# Patient Record
Sex: Female | Born: 1990 | Race: Black or African American | Hispanic: No | Marital: Single | Smoking: Former smoker
Health system: Southern US, Community
[De-identification: ages and names within clinical notes are randomized; demographics above are authoritative.]

---

## 2015-11-28 ENCOUNTER — Emergency Department (HOSPITAL_COMMUNITY): Payer: Medicaid Other

## 2015-11-28 ENCOUNTER — Encounter (HOSPITAL_COMMUNITY): Payer: Self-pay | Admitting: *Deleted

## 2015-11-28 ENCOUNTER — Emergency Department (HOSPITAL_COMMUNITY)
Admission: EM | Admit: 2015-11-28 | Discharge: 2015-11-28 | Disposition: A | Discharge status: Home / Self Care | Payer: Medicaid Other | Attending: Emergency Medicine | Admitting: Emergency Medicine

## 2015-11-28 DIAGNOSIS — S9031XA Contusion of right foot, initial encounter: Secondary | ICD-10-CM

## 2015-11-28 DIAGNOSIS — Y939 Activity, unspecified: Secondary | ICD-10-CM | POA: Insufficient documentation

## 2015-11-28 DIAGNOSIS — S99921A Unspecified injury of right foot, initial encounter: Secondary | ICD-10-CM | POA: Diagnosis present

## 2015-11-28 DIAGNOSIS — Y999 Unspecified external cause status: Secondary | ICD-10-CM | POA: Insufficient documentation

## 2015-11-28 DIAGNOSIS — Y92410 Unspecified street and highway as the place of occurrence of the external cause: Secondary | ICD-10-CM | POA: Diagnosis not present

## 2015-11-28 MED ORDER — IBUPROFEN 800 MG PO TABS: 800.0000 mg | ORAL_TABLET | Freq: Three times a day (TID) | ORAL | 0 refills | Status: DC

## 2015-11-28 NOTE — ED Notes (Signed)
ED Provider at bedside. 

## 2015-11-28 NOTE — ED Notes (Signed)
Pt verbalized understanding of discharge instructions and denies any further questions at this time.   
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
## 2016-07-25 ENCOUNTER — Encounter: Payer: Self-pay | Admitting: *Deleted

## 2016-07-25 ENCOUNTER — Ambulatory Visit (INDEPENDENT_AMBULATORY_CARE_PROVIDER_SITE_OTHER): Payer: Medicaid Other | Admitting: Certified Nurse Midwife

## 2016-07-25 ENCOUNTER — Encounter: Payer: Self-pay | Admitting: Certified Nurse Midwife

## 2016-07-25 ENCOUNTER — Other Ambulatory Visit (HOSPITAL_COMMUNITY)
Admission: RE | Admit: 2016-07-25 | Discharge: 2016-07-25 | Disposition: A | Discharge status: Home / Self Care | Payer: Medicaid Other | Source: Ambulatory Visit | Attending: Certified Nurse Midwife | Admitting: Certified Nurse Midwife

## 2016-07-25 VITALS — BP 116/71 | HR 98 | Ht 67.0 in | Wt 166.0 lb

## 2016-07-25 DIAGNOSIS — O0990 Supervision of high risk pregnancy, unspecified, unspecified trimester: Secondary | ICD-10-CM | POA: Insufficient documentation

## 2016-07-25 DIAGNOSIS — Z3482 Encounter for supervision of other normal pregnancy, second trimester: Secondary | ICD-10-CM

## 2016-07-25 DIAGNOSIS — Z3480 Encounter for supervision of other normal pregnancy, unspecified trimester: Secondary | ICD-10-CM

## 2016-07-25 MED ORDER — PRENATE PIXIE 10-0.6-0.4-200 MG PO CAPS: 1.0000 | ORAL_CAPSULE | Freq: Every day | ORAL | 12 refills | Status: AC

## 2016-07-25 NOTE — Progress Notes (Signed)
Pt states some chest/breast pain. Pt states some cramping.

## 2016-07-25 NOTE — Progress Notes (Signed)
Subjective:    Alexandra Arnold is being seen today for her first obstetrical visit.  This is not a planned pregnancy. She is at 4172w6d gestation. Her obstetrical history is significant for hx of twins in the family, patient has a twin sister in [place].  Moved here to [place] recently.  ?induction at term for oligio. Relationship with FOB: significant other, living together. Patient does intend to breast feed. Pregnancy history fully reviewed.  The information documented in the HPI was reviewed and verified.  Menstrual History: OB History    Gravida Para Term Preterm AB Living   2 1 1     1    SAB TAB Ectopic Multiple Live Births           1       Patient's last menstrual period was 04/05/2016.    History reviewed. No pertinent past medical history.  History reviewed. No pertinent surgical history.   (Not in a hospital admission) Not on File  Social History  Substance Use Topics  . Smoking status: Former Smoker    Types: Cigarettes  . Smokeless tobacco: Never Used     Comment: stopped at [redacted] week pregnant  . Alcohol use No    History reviewed. No pertinent family history.   Review of Systems Constitutional: negative for weight loss Gastrointestinal: negative for vomiting Genitourinary:negative for genital lesions and vaginal discharge and dysuria Musculoskeletal:negative for back pain Behavioral/Psych: negative for abusive relationship, depression, illegal drug usage and tobacco use    Objective:    BP 116/71   Pulse 98   Ht 5\' 7"  (1.702 m)   Wt 166 lb (75.3 kg)   LMP 04/05/2016   BMI 26.00 kg/m  General Appearance:    Alert, cooperative, no distress, appears stated age  Head:    Normocephalic, without obvious abnormality, atraumatic  Eyes:    PERRL, conjunctiva/corneas clear, EOM's intact, fundi    benign, both eyes  Ears:    Normal TM's and external ear canals, both ears  Nose:   Nares normal, septum midline, mucosa normal, no drainage    or sinus tenderness  Throat:   Lips,  mucosa, and tongue normal; teeth and gums normal  Neck:   Supple, symmetrical, trachea midline, no adenopathy;    thyroid:  no enlargement/tenderness/nodules; no carotid   bruit or JVD  Back:     Symmetric, no curvature, ROM normal, no CVA tenderness  Lungs:     Clear to auscultation bilaterally, respirations unlabored  Chest Wall:    No tenderness or deformity   Heart:    Regular rate and rhythm, S1 and S2 normal, no murmur, rub   or gallop  Breast Exam:    No tenderness, masses, or nipple abnormality  Abdomen:     Soft, non-tender, bowel sounds active all four quadrants,    no masses, no organomegaly  Genitalia:    Normal female without lesion, discharge or tenderness  Extremities:   Extremities normal, atraumatic, no cyanosis or edema  Pulses:   2+ and symmetric all extremities  Skin:   Skin color, texture, turgor normal, no rashes or lesions  Lymph nodes:   Cervical, supraclavicular, and axillary nodes normal  Neurologic:   CNII-XII intact, normal strength, sensation and reflexes    throughout          Cervix:  Long, thick, closed and posterior.  FHR: 150's by doppler.  Size c/w dates.     Lab Review Urine pregnancy test Labs reviewed yes Radiologic studies  reviewed no  Assessment & Plan    Pregnancy at [redacted]w[redacted]d weeks    1. Supervision of other normal pregnancy, antepartum     - Cytology - PAP - Cervicovaginal ancillary only - Hemoglobinopathy evaluation - Varicella zoster antibody, IgG - VITAMIN D 25 Hydroxy (Vit-D Deficiency, Fractures) - Culture, OB Urine - Obstetric Panel, Including HIV - Hemoglobin A1c - Cystic Fibrosis Mutation 97 - MaterniT21 PLUS Core+SCA - AFP, Serum, Open Spina Bifida - [place] MFM OB COMP + 14 WK; Future - Prenat-FeAsp-Meth-FA-DHA w/o A (PRENATE PIXIE) 10-0.6-0.4-200 MG CAPS; Take 1 tablet by mouth daily.  Dispense: 30 capsule; Refill: 12     Prenatal vitamins.  Counseling provided regarding continued use of seat belts, cessation of alcohol  consumption, smoking or use of illicit drugs; infection precautions i.e., influenza/TDAP immunizations, toxoplasmosis,CMV, parvovirus, listeria and varicella; workplace safety, exercise during pregnancy; routine dental care, safe medications, sexual activity, hot tubs, saunas, pools, travel, caffeine use, fish and methlymercury, potential toxins, hair treatments, varicose veins Weight gain recommendations per IOM guidelines reviewed: underweight/BMI< 18.5--> gain 28 - 40 lbs; normal weight/BMI 18.5 - 24.9--> gain 25 - 35 lbs; overweight/BMI 25 - 29.9--> gain 15 - 25 lbs; obese/BMI >30->gain  11 - 20 lbs Problem list reviewed and updated. FIRST/CF mutation testing/NIPT/QUAD SCREEN/fragile X/Ashkenazi Jewish population testing/Spinal muscular atrophy discussed: ordered. Role of ultrasound in pregnancy discussed; fetal survey: ordered. Amniocentesis discussed: not indicated.  Meds ordered this encounter  Medications  . Prenatal Vit-Fe Fumarate-FA (PRENATAL MULTIVITAMIN) TABS tablet    Sig: Take 1 tablet by mouth daily at 12 noon.  . Prenat-FeAsp-Meth-FA-DHA w/o A (PRENATE PIXIE) 10-0.6-0.4-200 MG CAPS    Sig: Take 1 tablet by mouth daily.    Dispense:  30 capsule    Refill:  12    Please process coupon: Rx BIN: [id number], RxPCN: [id number], RxGRP: [id number], RxID: [id number]  SUF: 01   Orders Placed This Encounter  Procedures  . Culture, OB Urine  . [place] MFM OB COMP + 14 WK    Standing Status:   Future    Standing Expiration Date:   09/25/2017    Order Specific Question:   Reason for Exam (SYMPTOM  OR DIAGNOSIS REQUIRED)    Answer:   fetal anatomy scan    Order Specific Question:   Preferred imaging location?    Answer:   [facility]  . Hemoglobinopathy evaluation  . Varicella zoster antibody, IgG  . VITAMIN D 25 Hydroxy (Vit-D Deficiency, Fractures)  . Obstetric Panel, Including HIV  . Hemoglobin A1c  . Cystic Fibrosis Mutation 97  . MaterniT21 PLUS Core+SCA    Order Specific Question:   Is  the patient insulin dependent?    Answer:   No    Order Specific Question:   Please enter gestational age. This should be expressed as weeks AND days, i.e. 16w 6d. Enter weeks here. Enter days in next question.    Answer:   35    Order Specific Question:   Please enter gestational age. This should be expressed as weeks AND days, i.e. 16w 6d. Enter days here. Enter weeks in previous question.    Answer:   6    Order Specific Question:   How was gestational age calculated?    Answer:   LMP    Order Specific Question:   Please give the date of LMP OR Ultrasound OR Estimated date of delivery.    Answer:   01/10/2017    Order Specific Question:   Number of Fetuses (  Type of Pregnancy):    Answer:   1    Order Specific Question:   Indications for performing the test? (please choose all that apply):    Answer:   Routine screening    Order Specific Question:   Other Indications? (Y=Yes, N=No)    Answer:   N    Order Specific Question:   If this is a repeat specimen, please indicate the reason:    Answer:   Not indicated    Order Specific Question:   Please specify the patient's race: (C=White/Caucasion, B=Black, I=Native American, A=Asian, H=Hispanic, O=Other, U=Unknown)    Answer:   B    Order Specific Question:   Donor Egg - indicate if the egg was obtained from in vitro fertilization.    Answer:   N    Order Specific Question:   Age of Egg Donor.    Answer:   [age]    Order Specific Question:   Prior Down Syndrome/ONTD screening during current pregnancy.    Answer:   N    Order Specific Question:   Prior First Trimester Testing    Answer:   N    Order Specific Question:   Prior Second Trimester Testing    Answer:   N    Order Specific Question:   Family History of Neural Tube Defects    Answer:   N    Order Specific Question:   Prior Pregnancy with Down Syndrome    Answer:   N    Order Specific Question:   Please give the patient's weight (in pounds)    Answer:   166  . AFP, Serum, Open  Spina Bifida    Order Specific Question:   Is patient insulin dependent?    Answer:   No    Order Specific Question:   Weight (lbs)    Answer:   166    Order Specific Question:   Gestational Age (GA), weeks    Answer:   15.6    Order Specific Question:   Date on which patient was at this GA    Answer:   07/25/2016    Order Specific Question:   GA Calculation Method    Answer:   LMP    Order Specific Question:   GA Date    Answer:   01/10/2017    Order Specific Question:   Number of fetuses    Answer:   1    Order Specific Question:   Donor egg?    Answer:   N    Order Specific Question:   Age of egg donor?    Answer:   26    Follow up in 4 weeks. 50% of 30 min visit spent on counseling and coordination of care.

## 2016-07-26 ENCOUNTER — Emergency Department (HOSPITAL_COMMUNITY): Payer: Medicaid Other

## 2016-07-26 ENCOUNTER — Encounter (HOSPITAL_COMMUNITY): Payer: Self-pay | Admitting: Emergency Medicine

## 2016-07-26 ENCOUNTER — Emergency Department (HOSPITAL_COMMUNITY)
Admission: EM | Admit: 2016-07-26 | Discharge: 2016-07-26 | Disposition: A | Discharge status: Home / Self Care | Payer: Medicaid Other | Attending: Emergency Medicine | Admitting: Emergency Medicine

## 2016-07-26 ENCOUNTER — Telehealth: Payer: Self-pay

## 2016-07-26 ENCOUNTER — Other Ambulatory Visit: Payer: Self-pay | Admitting: Certified Nurse Midwife

## 2016-07-26 DIAGNOSIS — Z87891 Personal history of nicotine dependence: Secondary | ICD-10-CM | POA: Diagnosis not present

## 2016-07-26 DIAGNOSIS — R1032 Left lower quadrant pain: Secondary | ICD-10-CM | POA: Diagnosis not present

## 2016-07-26 DIAGNOSIS — O9989 Other specified diseases and conditions complicating pregnancy, childbirth and the puerperium: Secondary | ICD-10-CM | POA: Insufficient documentation

## 2016-07-26 DIAGNOSIS — Z3A16 16 weeks gestation of pregnancy: Secondary | ICD-10-CM | POA: Diagnosis not present

## 2016-07-26 DIAGNOSIS — B9689 Other specified bacterial agents as the cause of diseases classified elsewhere: Secondary | ICD-10-CM

## 2016-07-26 DIAGNOSIS — N760 Acute vaginitis: Principal | ICD-10-CM

## 2016-07-26 DIAGNOSIS — O26892 Other specified pregnancy related conditions, second trimester: Secondary | ICD-10-CM

## 2016-07-26 DIAGNOSIS — O23592 Infection of other part of genital tract in pregnancy, second trimester: Secondary | ICD-10-CM | POA: Insufficient documentation

## 2016-07-26 DIAGNOSIS — R109 Unspecified abdominal pain: Secondary | ICD-10-CM

## 2016-07-26 LAB — URINALYSIS, ROUTINE W REFLEX MICROSCOPIC
Bacteria, UA: NONE SEEN
Bilirubin Urine: NEGATIVE
Glucose, UA: NEGATIVE mg/dL
Hgb urine dipstick: NEGATIVE
Ketones, ur: 20 mg/dL — AB
Nitrite: NEGATIVE
Protein, ur: NEGATIVE mg/dL
Specific Gravity, Urine: 1.014 (ref 1.005–1.030)
pH: 5 (ref 5.0–8.0)

## 2016-07-26 LAB — CBC WITH DIFFERENTIAL/PLATELET
Basophils Absolute: 0 10*3/uL (ref 0.0–0.1)
Basophils Relative: 0 %
Eosinophils Absolute: 0 10*3/uL (ref 0.0–0.7)
Eosinophils Relative: 1 %
HCT: 32.5 % — ABNORMAL LOW (ref 36.0–46.0)
Hemoglobin: 11.5 g/dL — ABNORMAL LOW (ref 12.0–15.0)
Lymphocytes Relative: 38 %
Lymphs Abs: 2.4 10*3/uL (ref 0.7–4.0)
MCH: 29.8 pg (ref 26.0–34.0)
MCHC: 35.4 g/dL (ref 30.0–36.0)
MCV: 84.2 fL (ref 78.0–100.0)
Monocytes Absolute: 0.3 10*3/uL (ref 0.1–1.0)
Monocytes Relative: 5 %
Neutro Abs: 3.6 10*3/uL (ref 1.7–7.7)
Neutrophils Relative %: 56 %
Platelets: 214 10*3/uL (ref 150–400)
RBC: 3.86 MIL/uL — ABNORMAL LOW (ref 3.87–5.11)
RDW: 13.5 % (ref 11.5–15.5)
WBC: 6.4 10*3/uL (ref 4.0–10.5)

## 2016-07-26 LAB — WET PREP, GENITAL
Sperm: NONE SEEN
Trich, Wet Prep: NONE SEEN
Yeast Wet Prep HPF POC: NONE SEEN

## 2016-07-26 LAB — COMPREHENSIVE METABOLIC PANEL
ALT: 16 U/L (ref 14–54)
AST: 19 U/L (ref 15–41)
Albumin: 3.4 g/dL — ABNORMAL LOW (ref 3.5–5.0)
Alkaline Phosphatase: 28 U/L — ABNORMAL LOW (ref 38–126)
Anion gap: 7 (ref 5–15)
BUN: 8 mg/dL (ref 6–20)
CO2: 23 mmol/L (ref 22–32)
Calcium: 8.7 mg/dL — ABNORMAL LOW (ref 8.9–10.3)
Chloride: 108 mmol/L (ref 101–111)
Creatinine, Ser: 0.52 mg/dL (ref 0.44–1.00)
GFR calc Af Amer: >60 mL/min (ref >60)
GFR calc non Af Amer: >60 mL/min (ref >60)
Glucose, Bld: 96 mg/dL (ref 65–99)
Potassium: 3.8 mmol/L (ref 3.5–5.1)
Sodium: 138 mmol/L (ref 135–145)
Total Bilirubin: 0.2 mg/dL — ABNORMAL LOW (ref 0.3–1.2)
Total Protein: 6.5 g/dL (ref 6.5–8.1)

## 2016-07-26 LAB — CERVICOVAGINAL ANCILLARY ONLY
Bacterial vaginitis: POSITIVE — AB
CANDIDA VAGINITIS: NEGATIVE
CHLAMYDIA, DNA PROBE: NEGATIVE
Neisseria Gonorrhea: NEGATIVE
Trichomonas: NEGATIVE

## 2016-07-26 LAB — I-STAT BETA HCG BLOOD, ED (MC, WL, AP ONLY): I-stat hCG, quantitative: >2000 m[IU]/mL — ABNORMAL HIGH (ref <5)

## 2016-07-26 MED ORDER — METRONIDAZOLE 500 MG PO TABS
500.0000 mg | ORAL_TABLET | Freq: Once | ORAL | Status: AC
  Administered 2016-07-26: 500 mg via ORAL
  Filled 2016-07-26: qty 1

## 2016-07-26 MED ORDER — ACETAMINOPHEN 500 MG PO TABS
1000.0000 mg | ORAL_TABLET | Freq: Once | ORAL | Status: AC
  Administered 2016-07-26: 1000 mg via ORAL
  Filled 2016-07-26: qty 2

## 2016-07-26 MED ORDER — METRONIDAZOLE 500 MG PO TABS: 500.0000 mg | ORAL_TABLET | Freq: Two times a day (BID) | ORAL | 0 refills | Status: DC

## 2016-07-26 MED ORDER — METRONIDAZOLE 500 MG PO TABS: 500.0000 mg | ORAL_TABLET | Freq: Two times a day (BID) | ORAL | 0 refills | Status: AC

## 2016-07-26 NOTE — ED Notes (Signed)
Pelvic cart set up & ready for pt exam.

## 2016-07-26 NOTE — ED Triage Notes (Signed)
Pt complaint of left lower abd cramping without vaginal bleeding or discharge onset 1200 today; [facility], Lendell CapriceSullivan, MD. Pt verbalizes is 14 weeks; with triage 158 FHT heard with doppler.

## 2016-07-26 NOTE — ED Notes (Signed)
Pt ambulatory and independent at discharge.  Verbalized understanding of discharge instructions 

## 2016-07-26 NOTE — ED Provider Notes (Signed)
[facility]-EMERGENCY DEPT Provider Note   CSN: [id number] Arrival date & time: 07/26/16  1727     History   Chief Complaint Chief Complaint  Patient presents with  . Abdominal Cramping    HPI Alexandra Arnold is a 26 y.o. female G2P1A0 female who is [redacted] weeks pregnant who presents today with chief complaint acute onset, progressively worsening left lower quadrant abdominal pain which began earlier today at around noon 6 hours ago. Patient states that pain was initially mild and 5/10 throbbing pain and acutely worsened  To 7/10 sharp stabbing pain 2 hours ago. At times pain radiates to the right lower quadrant. No aggravating or alleviating factors noted. She has not tried anything for her symptoms. States she had a very small bowel movement earlier today but states she has a normal bowel movement daily. States she has been having increased vaginal discharge for some time, but denies malodorous discharge, vaginal itching, vaginal pain or bleeding. Denies nausea, vomiting, dysuria, hematuria, other urinary symptoms, melena, hematochezia, or diarrhea. Denies chest pain, shortness of breath, fevers, headaches. Had her first OB evaluation for this pregnancy yesterday and is scheduled for an ultrasound in 2.5 weeks. She denies smoking, alcohol use, or drug use.  The history is provided by the patient.    History reviewed. No pertinent past medical history.  Patient Active Problem List   Diagnosis Date Noted  . Supervision of other normal pregnancy, antepartum 07/25/2016    History reviewed. No pertinent surgical history.  OB History    Gravida Para Term Preterm AB Living   2 1 1     1    SAB TAB Ectopic Multiple Live Births           1       Home Medications    Prior to Admission medications   Medication Sig Start Date End Date Taking? Authorizing Provider  Prenat-FeAsp-Meth-FA-DHA w/o A (PRENATE PIXIE) 10-0.6-0.4-200 MG CAPS Take 1 tablet by mouth daily. 07/25/16  Yes Denney, Rachelle A,  CNM  metroNIDAZOLE (FLAGYL) 500 MG tablet Take 1 tablet (500 mg total) by mouth 2 (two) times daily. 07/26/16 08/02/16  Jeanie SewerFawze, Vannessa Godown A, PA-C    Family History No family history on file.  Social History Social History  Substance Use Topics  . Smoking status: Former Smoker    Types: Cigarettes  . Smokeless tobacco: Never Used     Comment: stopped at [redacted] week pregnant  . Alcohol use No     Allergies   Patient has no known allergies.   Review of Systems Review of Systems  Constitutional: Negative for fever.  Respiratory: Negative for shortness of breath.   Cardiovascular: Negative for chest pain.  Gastrointestinal: Positive for abdominal pain and constipation. Negative for blood in stool, diarrhea, nausea and vomiting.  Genitourinary: Negative for decreased urine volume, dysuria, frequency, urgency, vaginal bleeding, vaginal discharge and vaginal pain.  Neurological: Negative for syncope and headaches.  All other systems reviewed and are negative.    Physical Exam Updated Vital Signs BP 119/71 (BP Location: Left Arm)   Pulse 86   Temp 98.1 F (36.7 C) (Oral)   Resp 12   LMP 04/05/2016   SpO2 99%   Physical Exam  Constitutional: She appears well-developed and well-nourished. No distress.  Resting in bed, appears uncomfortable intermittently and clutching left side  HENT:  Head: Normocephalic and atraumatic.  Eyes: Conjunctivae are normal. Right eye exhibits no discharge. Left eye exhibits no discharge.  Neck: No JVD present. No tracheal  deviation present.  Cardiovascular: Normal rate, regular rhythm and normal heart sounds.   Pulmonary/Chest: Effort normal and breath sounds normal.  Abdominal: Soft. Bowel sounds are normal. She exhibits no distension. There is tenderness. There is guarding.  Left lower quadrant tender to palpation. No tenderness palpation at McBurney's point, Rovsing's absent, Psoas sign absent. No CVA tenderness  Genitourinary:  Genitourinary Comments:  Examination performed in the presence of a chaperone. No lesions to the external genitalia. Cervical os is closed with no bleeding. Copious white discharge in the vaginal vault. No masses or lesions to the vaginal wall. No cervical motion tenderness, no adnexal tenderness. Uterus is soft and symmetrical and palpable between the umbilicus and the pubic symphysis.  Musculoskeletal: She exhibits no edema.  No midline spine TTP, no paraspinal muscle tenderness, no deformity, crepitus, or step-off noted   Neurological: She is alert.  Skin: Skin is warm and dry. No erythema.  Psychiatric: She has a normal mood and affect. Her behavior is normal.  Nursing note and vitals reviewed.    ED Treatments / Results  Labs (all labs ordered are listed, but only abnormal results are displayed) Labs Reviewed  WET PREP, GENITAL - Abnormal; Notable for the following:       Result Value   Clue Cells Wet Prep HPF POC PRESENT (*)    WBC, Wet Prep HPF POC MANY (*)    All other components within normal limits  COMPREHENSIVE METABOLIC PANEL - Abnormal; Notable for the following:    Calcium 8.7 (*)    Albumin 3.4 (*)    Alkaline Phosphatase 28 (*)    Total Bilirubin 0.2 (*)    All other components within normal limits  CBC WITH DIFFERENTIAL/PLATELET - Abnormal; Notable for the following:    RBC 3.86 (*)    Hemoglobin 11.5 (*)    HCT 32.5 (*)    All other components within normal limits  URINALYSIS, ROUTINE W REFLEX MICROSCOPIC - Abnormal; Notable for the following:    Ketones, ur 20 (*)    Leukocytes, UA TRACE (*)    Squamous Epithelial / LPF 0-5 (*)    All other components within normal limits  I-STAT BETA HCG BLOOD, ED ([facility], [facility], [facility] ONLY) - Abnormal; Notable for the following:    I-stat hCG, quantitative >2,000.0 (*)    All other components within normal limits  RPR  HIV ANTIBODY (ROUTINE TESTING)  GC/CHLAMYDIA PROBE AMP ([place]) NOT AT [facility]    EKG  EKG Interpretation None        Radiology US Ob Limited > 14 Wks  Result Date: 07/26/2016 CLINICAL DATA:  Upper quadrant cramps for 1 day. Estimated gestational age by LMP is 16 weeks 0 days. EXAM: LIMITED OBSTETRIC ULTRASOUND FINDINGS: Number of Fetuses: 1 Heart Rate:  144 bpm Movement: Fetal movement is observed. Presentation: Variable presentation is observed. Placental Location: Placenta is posterior. Previa: No previa. Amniotic Fluid (Subjective): Within normal limits. Largest vertical pocket is measured at 4.6 cm. BPD:  3.3 cmcm 16w  1d MATERNAL FINDINGS: Cervix:  Appears closed. Uterus/Adnexae: Limited visualization of the uterus. No focal abnormalities identified as visualized. Right ovary is not identified. Left ovary is visualized, measuring 4 x 2.2 x 2.9 cm. There is a left ovarian cyst measuring 2.4 cm maximal diameter. No abnormal adnexal masses. IMPRESSION: A single intrauterine pregnancy is identified. Fetus is in variable presentation. Fetal motion and cardiac activity are observed. No placenta previa. No acute complication demonstrated on limited evaluation. This exam is  performed on an emergent basis and does not comprehensively evaluate fetal size, dating, or anatomy; follow-up complete OB US should be considered if further fetal assessment is warranted. Electronically Signed   By: Burman NievesWilliam  Stevens M.D.   On: 07/26/2016 19:52    Procedures Procedures (including critical care time)  Medications Ordered in ED Medications  acetaminophen (TYLENOL) tablet 1,000 mg (1,000 mg Oral Given 07/26/16 1845)  metroNIDAZOLE (FLAGYL) tablet 500 mg (500 mg Oral Given 07/26/16 2104)     Initial Impression / Assessment and Plan / ED Course  I have reviewed the triage vital signs and the nursing notes.  Pertinent labs & imaging results that were available during my care of the patient were reviewed by me and considered in my medical decision making (see chart for details).     Patient who is [redacted] weeks pregnant presents  with complaint of left lower quadrant abdominal pain which began earlier today. Afebrile, initially tachycardic with resolution while in the ED. Pain improved with Tylenol. No leukocytosis or anemia, no significant electrolyte abnormalities. UA not concerning for UTI or nephrolithiasis. Wet prep does show clue cells and WBCs consistent with BV. OB ultrasound shows a single intrauterine pregnancy with fetal motion and cardiac activity observed. In the absence of vaginal bleeding, doubt placenta upper she have or placenta previa. Patient is not hypertensive, doubt preeclampsia. Ultrasound tech mention to patient that the fetus is sitting in her left lower quadrant and "kicking", which may be contributing to her pain. Doubt ovarian torsion, PID, or TOA. She is stable for discharge home with Flagyl for treatment of BV, first dose given in the ED. Recommend follow-up with OB/GYN this week for reevaluation. Discussed indications for return to the ED. Pt verbalized understanding of and agreement with plan and is safe for discharge home at this time.  Final Clinical Impressions(s) / ED Diagnoses   Final diagnoses:  BV (bacterial vaginosis)  Abdominal pain during pregnancy in second trimester    New Prescriptions Discharge Medication List as of 07/26/2016  8:49 PM       Jeanie SewerFawze, Kenley Rettinger A, PA-C 07/26/16 2233    Rolan BuccoBelfi, Melanie, MD 07/26/16 2321

## 2016-07-26 NOTE — ED Notes (Signed)
Patient transported to US 

## 2016-07-26 NOTE — Telephone Encounter (Signed)
Attempted to contact about results and rx, left vm. 

## 2016-07-26 NOTE — Discharge Instructions (Signed)
Please take all of your antibiotics until finished!   You may develop abdominal discomfort or diarrhea from the antibiotic.  You may help offset this with probiotics which you can buy or get in yogurt. Do not eat  or take the probiotics until 2 hours after your antibiotic. Do not drink alcohol while on this medication. Take Tylenol as needed for pain. 500 mg Tylenol every 6 hours as needed for pain. Follow-up with your OB/GYN for reevaluation of symptoms this week. Return to the ED immediately if any concerning signs or symptoms develop such as fever, vaginal bleeding, blood in your urine or stool

## 2016-07-27 ENCOUNTER — Telehealth: Payer: Self-pay

## 2016-07-27 LAB — HIV ANTIBODY (ROUTINE TESTING W REFLEX): HIV Screen 4th Generation wRfx: NONREACTIVE

## 2016-07-27 LAB — CULTURE, OB URINE

## 2016-07-27 LAB — CYTOLOGY - PAP: Diagnosis: NEGATIVE

## 2016-07-27 LAB — URINE CULTURE, OB REFLEX

## 2016-07-27 LAB — GC/CHLAMYDIA PROBE AMP (~~LOC~~) NOT AT ARMC
Chlamydia: NEGATIVE
Neisseria Gonorrhea: NEGATIVE

## 2016-07-27 LAB — RPR: RPR Ser Ql: NONREACTIVE

## 2016-07-27 NOTE — Telephone Encounter (Signed)
Advised of results and rx sent 

## 2016-07-30 LAB — OBSTETRIC PANEL, INCLUDING HIV
Antibody Screen: NEGATIVE
BASOS ABS: 0 10*3/uL (ref 0.0–0.2)
Basos: 0 %
EOS (ABSOLUTE): 0 10*3/uL (ref 0.0–0.4)
Eos: 0 %
HIV SCREEN 4TH GENERATION: NONREACTIVE
Hematocrit: 35.9 % (ref 34.0–46.6)
Hemoglobin: 12.7 g/dL (ref 11.1–15.9)
Hepatitis B Surface Ag: NEGATIVE
Immature Grans (Abs): 0 10*3/uL (ref 0.0–0.1)
Immature Granulocytes: 0 %
LYMPHS ABS: 2.8 10*3/uL (ref 0.7–3.1)
Lymphs: 41 %
MCH: 29.4 pg (ref 26.6–33.0)
MCHC: 35.4 g/dL (ref 31.5–35.7)
MCV: 83 fL (ref 79–97)
Monocytes Absolute: 0.4 10*3/uL (ref 0.1–0.9)
Monocytes: 6 %
NEUTROS ABS: 3.6 10*3/uL (ref 1.4–7.0)
Neutrophils: 53 %
Platelets: 252 10*3/uL (ref 150–379)
RBC: 4.32 x10E6/uL (ref 3.77–5.28)
RDW: 14.2 % (ref 12.3–15.4)
RPR Ser Ql: NONREACTIVE
Rh Factor: POSITIVE
Rubella Antibodies, IGG: 17.7 index (ref >0.99)
WBC: 6.9 10*3/uL (ref 3.4–10.8)

## 2016-07-30 LAB — MATERNIT21 PLUS CORE+SCA
CHROMOSOME 18: NEGATIVE
Chromosome 13: NEGATIVE
Chromosome 21: NEGATIVE
Y CHROMOSOME: DETECTED

## 2016-07-30 LAB — HEMOGLOBINOPATHY EVALUATION
HEMOGLOBIN A2 QUANTITATION: 1.3 % — AB (ref 1.8–3.2)
HGB A: 97.4 % (ref 96.4–98.8)
HGB C: 0 %
HGB S: 0 %
HGB VARIANT: 1.3 % — AB
Hemoglobin F Quantitation: 0 % (ref 0.0–2.0)

## 2016-07-30 LAB — HEMOGLOBIN A1C
ESTIMATED AVERAGE GLUCOSE: 97 mg/dL
HEMOGLOBIN A1C: 5 % (ref 4.8–5.6)

## 2016-07-30 LAB — VARICELLA ZOSTER ANTIBODY, IGG: Varicella zoster IgG: 156 index — ABNORMAL LOW (ref >165)

## 2016-07-30 LAB — VITAMIN D 25 HYDROXY (VIT D DEFICIENCY, FRACTURES): Vit D, 25-Hydroxy: 20.3 ng/mL — ABNORMAL LOW (ref 30.0–100.0)

## 2016-07-31 ENCOUNTER — Other Ambulatory Visit: Payer: Self-pay | Admitting: Certified Nurse Midwife

## 2016-07-31 DIAGNOSIS — Z3480 Encounter for supervision of other normal pregnancy, unspecified trimester: Secondary | ICD-10-CM

## 2016-07-31 DIAGNOSIS — R7989 Other specified abnormal findings of blood chemistry: Secondary | ICD-10-CM | POA: Insufficient documentation

## 2016-07-31 DIAGNOSIS — Z2839 Other underimmunization status: Secondary | ICD-10-CM | POA: Insufficient documentation

## 2016-07-31 DIAGNOSIS — O09899 Supervision of other high risk pregnancies, unspecified trimester: Secondary | ICD-10-CM | POA: Insufficient documentation

## 2016-07-31 DIAGNOSIS — Z283 Underimmunization status: Principal | ICD-10-CM

## 2016-07-31 MED ORDER — VITAMIN D (ERGOCALCIFEROL) 1.25 MG (50000 UNIT) PO CAPS: 50000.0000 [IU] | ORAL_CAPSULE | ORAL | 2 refills | Status: AC

## 2016-08-03 ENCOUNTER — Other Ambulatory Visit: Payer: Self-pay | Admitting: Certified Nurse Midwife

## 2016-08-03 DIAGNOSIS — Z3480 Encounter for supervision of other normal pregnancy, unspecified trimester: Secondary | ICD-10-CM

## 2016-08-03 LAB — CYSTIC FIBROSIS MUTATION 97: GENE DIS ANAL CARRIER INTERP BLD/T-IMP: NOT DETECTED

## 2016-08-06 ENCOUNTER — Other Ambulatory Visit: Payer: Self-pay | Admitting: Certified Nurse Midwife

## 2016-08-06 DIAGNOSIS — Z3480 Encounter for supervision of other normal pregnancy, unspecified trimester: Secondary | ICD-10-CM

## 2016-08-06 LAB — AFP, SERUM, OPEN SPINA BIFIDA
AFP MOM: 1.13
AFP Value: 37.5 ng/mL
Gest. Age on Collection Date: 15.9 weeks
MATERNAL AGE AT EDD: 26.5 a
OSBR RISK 1 IN: 10000
TEST RESULTS AFP: NEGATIVE
Weight: 166 [lb_av]

## 2016-08-14 ENCOUNTER — Ambulatory Visit (HOSPITAL_COMMUNITY)
Admission: RE | Admit: 2016-08-14 | Discharge: 2016-08-14 | Disposition: A | Discharge status: Home / Self Care | Payer: Medicaid Other | Source: Ambulatory Visit | Attending: Certified Nurse Midwife | Admitting: Certified Nurse Midwife

## 2016-08-14 ENCOUNTER — Other Ambulatory Visit (HOSPITAL_COMMUNITY): Payer: Self-pay | Admitting: *Deleted

## 2016-08-14 DIAGNOSIS — Z3A18 18 weeks gestation of pregnancy: Secondary | ICD-10-CM | POA: Insufficient documentation

## 2016-08-14 DIAGNOSIS — O359XX0 Maternal care for (suspected) fetal abnormality and damage, unspecified, not applicable or unspecified: Secondary | ICD-10-CM

## 2016-08-14 DIAGNOSIS — Z363 Encounter for antenatal screening for malformations: Secondary | ICD-10-CM | POA: Diagnosis not present

## 2016-08-14 DIAGNOSIS — Z3480 Encounter for supervision of other normal pregnancy, unspecified trimester: Secondary | ICD-10-CM

## 2016-08-16 ENCOUNTER — Other Ambulatory Visit: Payer: Self-pay | Admitting: Certified Nurse Midwife

## 2016-08-16 DIAGNOSIS — Z3480 Encounter for supervision of other normal pregnancy, unspecified trimester: Secondary | ICD-10-CM

## 2016-08-21 ENCOUNTER — Encounter (HOSPITAL_COMMUNITY): Payer: Self-pay

## 2016-08-22 ENCOUNTER — Encounter: Payer: Self-pay | Admitting: Certified Nurse Midwife

## 2016-08-22 ENCOUNTER — Ambulatory Visit (INDEPENDENT_AMBULATORY_CARE_PROVIDER_SITE_OTHER): Payer: Medicaid Other | Admitting: Certified Nurse Midwife

## 2016-08-22 VITALS — BP 107/69 | HR 113 | Wt 171.0 lb

## 2016-08-22 DIAGNOSIS — R7989 Other specified abnormal findings of blood chemistry: Secondary | ICD-10-CM

## 2016-08-22 DIAGNOSIS — Z283 Underimmunization status: Secondary | ICD-10-CM

## 2016-08-22 DIAGNOSIS — O0992 Supervision of high risk pregnancy, unspecified, second trimester: Secondary | ICD-10-CM | POA: Diagnosis not present

## 2016-08-22 DIAGNOSIS — O09899 Supervision of other high risk pregnancies, unspecified trimester: Secondary | ICD-10-CM

## 2016-08-22 DIAGNOSIS — E559 Vitamin D deficiency, unspecified: Secondary | ICD-10-CM

## 2016-08-22 DIAGNOSIS — O0990 Supervision of high risk pregnancy, unspecified, unspecified trimester: Secondary | ICD-10-CM

## 2016-08-22 NOTE — Progress Notes (Addendum)
   PRENATAL VISIT NOTE  Subjective:  Jenique Berendsen is a 26 y.o. G2P1001 at [redacted]w[redacted]d being seen today for ongoing prenatal care.  She is currently monitored for the following issues for this high-risk pregnancy and has Supervision of other normal pregnancy, antepartum; Maternal varicella, non-immune; and Low vitamin D level on her problem list.  Patient reports no complaints.  Contractions: Not present. Vag. Bleeding: None.  Movement: Present. Denies leaking of fluid.   The following portions of the patient's history were reviewed and updated as appropriate: allergies, current medications, past family history, past medical history, past social history, past surgical history and problem list. Problem list updated.  Objective:   Vitals:   08/22/16 0826  BP: 107/69  Pulse: (!) 113  Weight: 171 lb (77.6 kg)    Fetal Status: Fetal Heart Rate (bpm): 155; doppler Fundal Height: 19 cm Movement: Present     General:  Alert, oriented and cooperative. Patient is in no acute distress.  Skin: Skin is warm and dry. No rash noted.   Cardiovascular: Normal heart rate noted  Respiratory: Normal respiratory effort, no problems with respiration noted  Abdomen: Soft, gravid, appropriate for gestational age.  Pain/Pressure: Absent     Pelvic: Cervical exam deferred        Extremities: Normal range of motion.  Edema: None  Mental Status:  Normal mood and affect. Normal behavior. Normal judgment and thought content.   Assessment and Plan:  Pregnancy: G2P1001 at [redacted]w[redacted]d  1. 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  Declines amniocentesis. Planning on delivering at [facility].   2. Low vitamin D level     Taking weekly vitamin D  3. Maternal varicella, non-immune     Varicella postpartum  Preterm labor symptoms and general obstetric precautions including but not limited to vaginal bleeding, contractions, leaking of fluid and fetal movement were  reviewed in detail with the patient. Please refer to After Visit Summary for other counseling recommendations.  Return in about 4 weeks (around 09/19/2016) for [facility].   Roe Coombs, CNM

## 2016-08-28 ENCOUNTER — Other Ambulatory Visit (HOSPITAL_COMMUNITY): Payer: Self-pay | Admitting: Obstetrics and Gynecology

## 2016-08-28 ENCOUNTER — Ambulatory Visit (HOSPITAL_COMMUNITY): Admission: RE | Admit: 2016-08-28 | Payer: Medicaid Other | Source: Ambulatory Visit

## 2016-08-28 ENCOUNTER — Ambulatory Visit (HOSPITAL_COMMUNITY)
Admission: RE | Admit: 2016-08-28 | Discharge: 2016-08-28 | Disposition: A | Discharge status: Home / Self Care | Payer: Medicaid Other | Source: Ambulatory Visit | Attending: Certified Nurse Midwife | Admitting: Certified Nurse Midwife

## 2016-08-28 ENCOUNTER — Other Ambulatory Visit (HOSPITAL_COMMUNITY): Payer: Self-pay | Admitting: *Deleted

## 2016-08-28 ENCOUNTER — Encounter (HOSPITAL_COMMUNITY): Payer: Self-pay

## 2016-08-28 DIAGNOSIS — Z362 Encounter for other antenatal screening follow-up: Secondary | ICD-10-CM | POA: Diagnosis not present

## 2016-08-28 DIAGNOSIS — O09292 Supervision of pregnancy with other poor reproductive or obstetric history, second trimester: Secondary | ICD-10-CM | POA: Insufficient documentation

## 2016-08-28 DIAGNOSIS — O358XX0 Maternal care for other (suspected) fetal abnormality and damage, not applicable or unspecified: Secondary | ICD-10-CM

## 2016-08-28 DIAGNOSIS — Z3A20 20 weeks gestation of pregnancy: Secondary | ICD-10-CM

## 2016-08-28 DIAGNOSIS — O359XX0 Maternal care for (suspected) fetal abnormality and damage, unspecified, not applicable or unspecified: Secondary | ICD-10-CM | POA: Diagnosis present

## 2016-08-28 DIAGNOSIS — O35BXX0 Maternal care for other (suspected) fetal abnormality and damage, fetal cardiac anomalies, not applicable or unspecified: Secondary | ICD-10-CM

## 2016-08-28 DIAGNOSIS — O289 Unspecified abnormal findings on antenatal screening of mother: Secondary | ICD-10-CM | POA: Diagnosis not present

## 2016-08-28 NOTE — Addendum Note (Signed)
Encounter addended by: Vivien Rota, RT on: 08/28/2016 10:17 AM<BR>    Actions taken: Imaging Exam ended

## 2016-08-30 ENCOUNTER — Other Ambulatory Visit: Payer: Self-pay | Admitting: Certified Nurse Midwife

## 2016-09-19 ENCOUNTER — Encounter: Payer: Medicaid Other | Admitting: Obstetrics & Gynecology

## 2016-09-26 ENCOUNTER — Telehealth: Payer: Self-pay

## 2016-09-26 NOTE — Telephone Encounter (Signed)
Pt no showed for last appt. Called pt left voice mail to schedule appt.

## 2016-10-23 ENCOUNTER — Encounter (HOSPITAL_COMMUNITY): Payer: Self-pay

## 2016-10-23 ENCOUNTER — Ambulatory Visit (HOSPITAL_COMMUNITY)
Admission: RE | Admit: 2016-10-23 | Discharge: 2016-10-23 | Disposition: A | Discharge status: Home / Self Care | Payer: Medicaid Other | Source: Ambulatory Visit | Attending: Certified Nurse Midwife | Admitting: Certified Nurse Midwife

## 2017-01-07 MED ORDER — BENZOCAINE 20 % EX AERO
1.00 | INHALATION_SPRAY | CUTANEOUS | Status: DC
Start: ? — End: 2017-01-07

## 2017-01-07 MED ORDER — METHYLERGONOVINE MALEATE 0.2 MG PO TABS
0.20 | ORAL_TABLET | ORAL | Status: DC
Start: ? — End: 2017-01-07

## 2017-01-07 MED ORDER — TUCKS 50 % EX PADS
1.00 | MEDICATED_PAD | CUTANEOUS | Status: DC
Start: ? — End: 2017-01-07

## 2017-01-07 MED ORDER — ACETAMINOPHEN 325 MG PO TABS
650.00 | ORAL_TABLET | ORAL | Status: DC
Start: ? — End: 2017-01-07

## 2017-01-07 MED ORDER — ONDANSETRON HCL 4 MG/2ML IJ SOLN
4.00 | INTRAMUSCULAR | Status: DC
Start: ? — End: 2017-01-07

## 2017-01-07 MED ORDER — HYDROCORTISONE ACETATE 25 MG RE SUPP
25.00 | RECTAL | Status: DC
Start: ? — End: 2017-01-07

## 2017-01-07 MED ORDER — IRON PO
1.00 | ORAL | Status: DC
Start: 2017-01-08 — End: 2017-01-07

## 2017-01-07 MED ORDER — DOCUSATE SODIUM 100 MG PO CAPS
100.00 | ORAL_CAPSULE | ORAL | Status: DC
Start: 2017-01-07 — End: 2017-01-07

## 2017-01-07 MED ORDER — IBUPROFEN 600 MG PO TABS
600.00 | ORAL_TABLET | ORAL | Status: DC
Start: ? — End: 2017-01-07

## 2017-04-04 ENCOUNTER — Encounter (HOSPITAL_COMMUNITY): Payer: Self-pay

## 2017-10-21 ENCOUNTER — Emergency Department (HOSPITAL_COMMUNITY)
Admission: EM | Admit: 2017-10-21 | Discharge: 2017-10-21 | Disposition: A | Discharge status: Home / Self Care | Payer: Medicaid Other | Attending: Emergency Medicine | Admitting: Emergency Medicine

## 2017-10-21 DIAGNOSIS — H5711 Ocular pain, right eye: Secondary | ICD-10-CM | POA: Insufficient documentation

## 2017-10-21 DIAGNOSIS — Z5321 Procedure and treatment not carried out due to patient leaving prior to being seen by health care provider: Secondary | ICD-10-CM | POA: Diagnosis not present

## 2017-10-21 NOTE — ED Notes (Signed)
Pt called for room no answer

## 2017-10-21 NOTE — ED Notes (Signed)
Pt called 2 more times. Once by this RN  Another by NT.

## 2017-10-21 NOTE — ED Triage Notes (Signed)
Pt to ER for evaluation of right eye injury after child stuck his finger in there this morning. Pt vision intact, pain with opening eye. Pupils equal and round. Sclera red.

## 2018-05-02 IMAGING — US US MFM OB COMP +14 WKS
1 series · 14 of 28 positions shown · non-contrast
Comparison: none

[Series 1: us mfm ob comp +14 wks · 52 acquisitions, 14 frames shown]
[im 2/52]
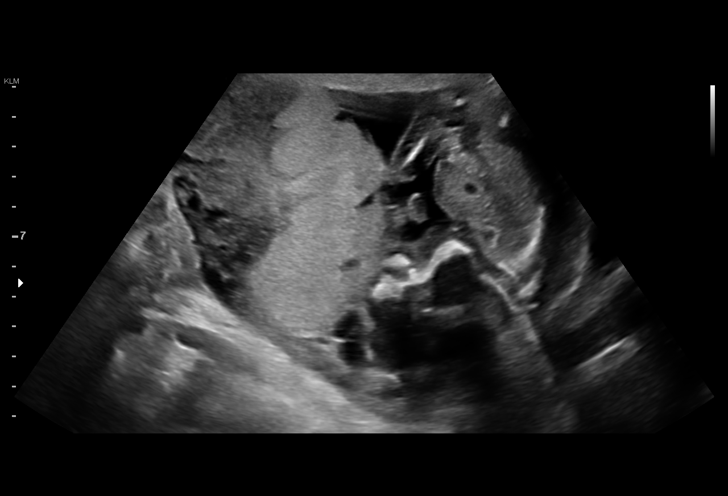
[im 6/52]
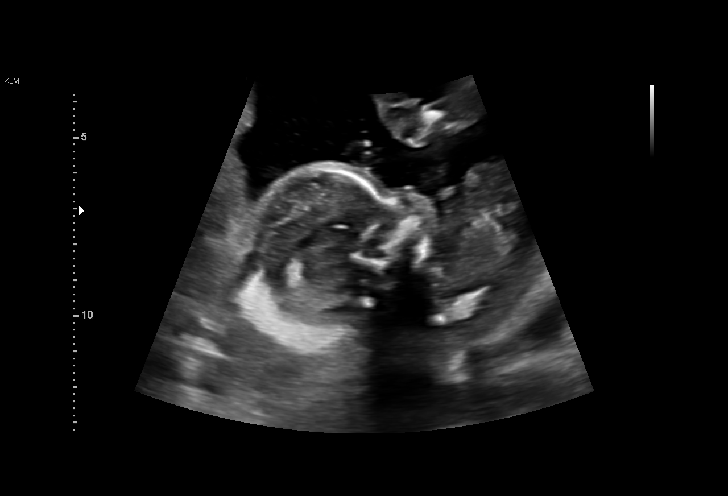
[im 10/52]
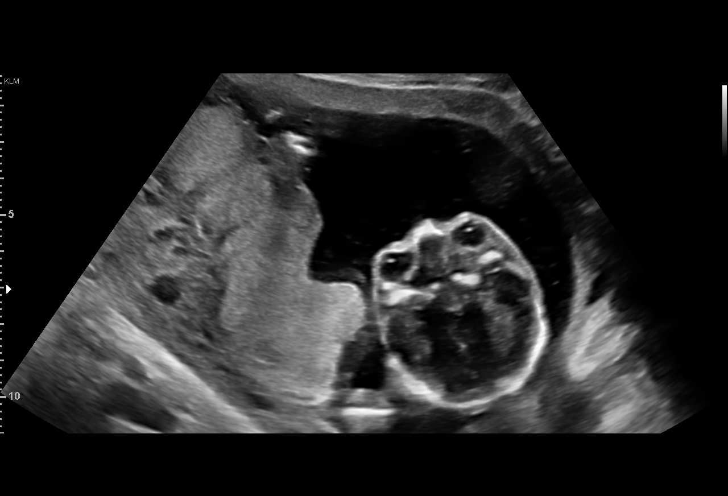
[im 14/52]
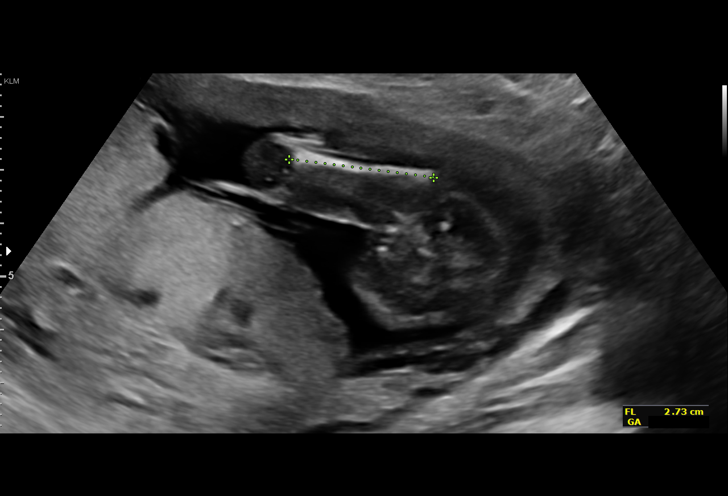
[im 18/52]
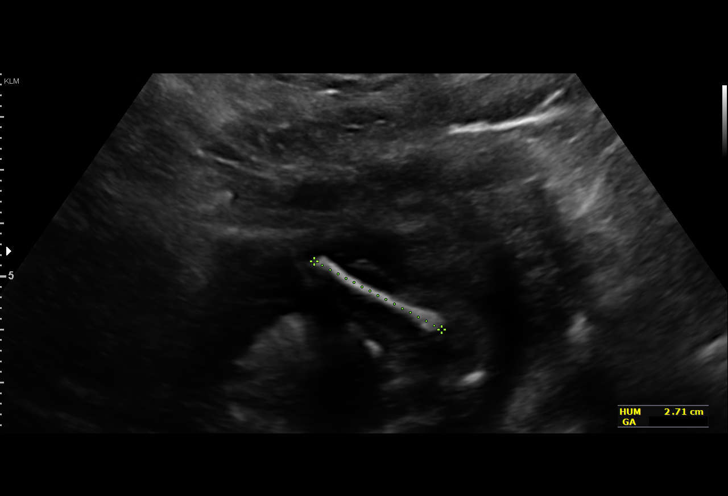
[im 21/52]
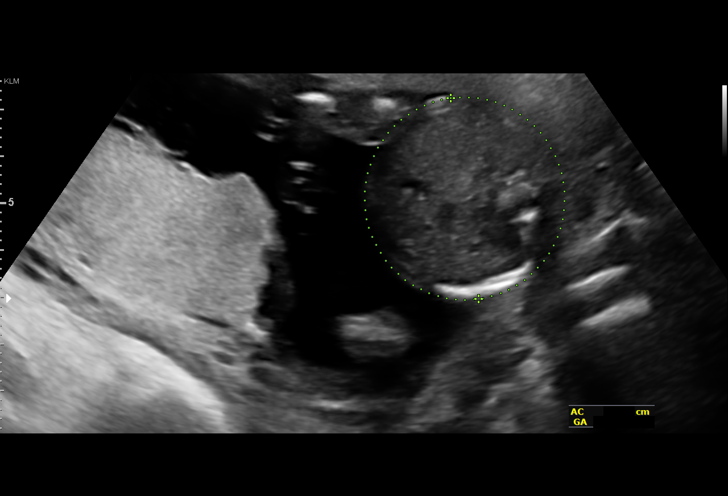
[im 25/52]
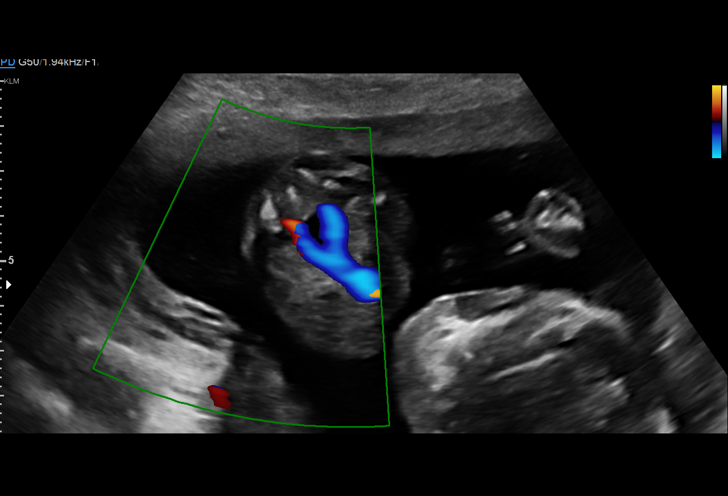
[im 29/52]
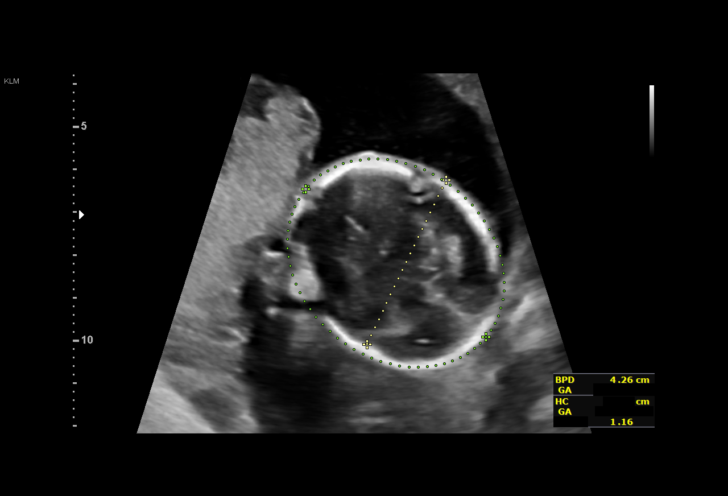
[im 33/52]
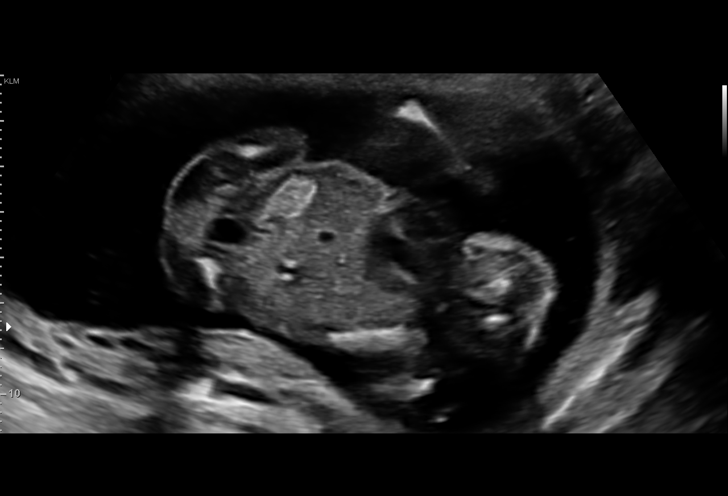
[im 36/52]
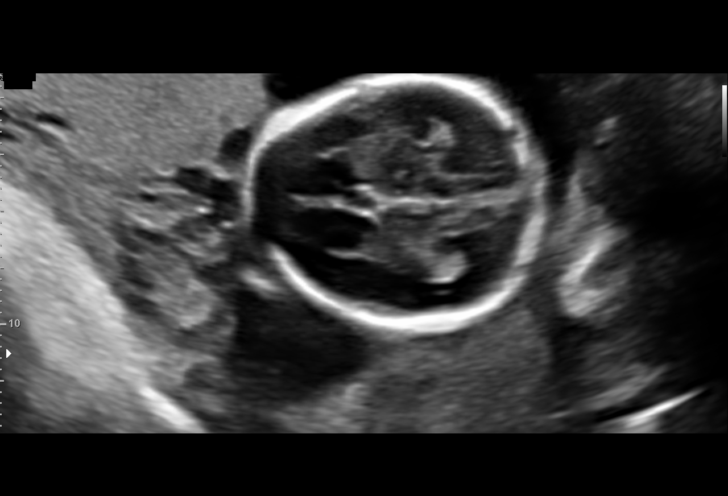
[im 40/52]
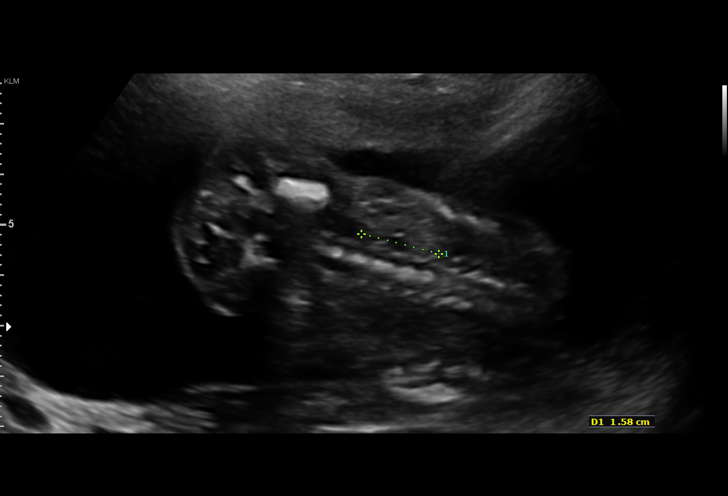
[im 44/52]
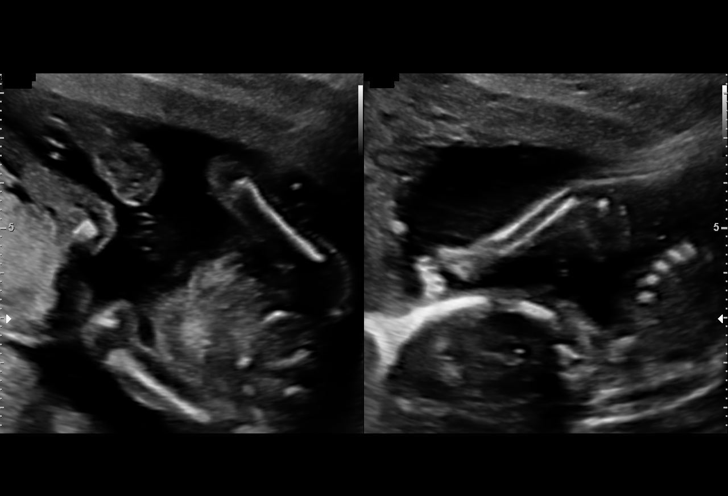
[im 48/52]
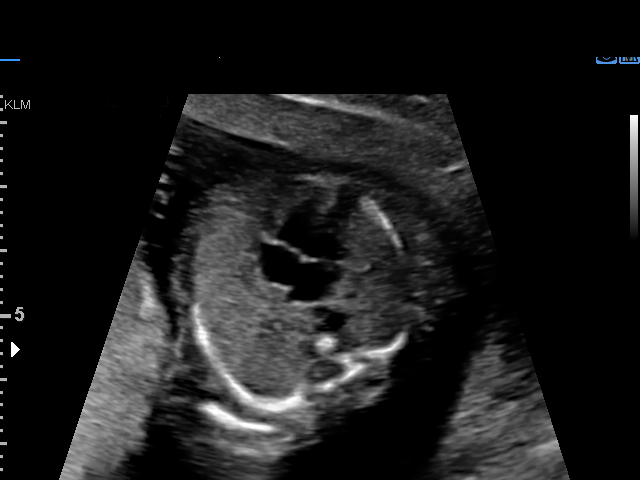
[im 52/52]
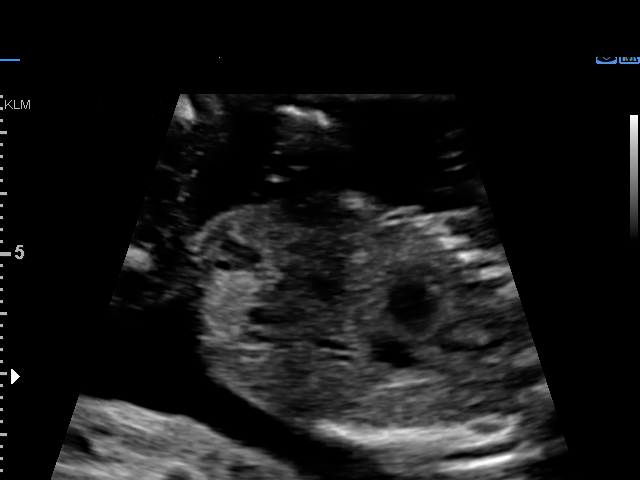

[14 of 28 positions shown; findings below may reference images not displayed]

Road [HOSPITAL]

Indications

18 weeks gestation of pregnancy
Encounter for antenatal screening for
malformations
Poor obstetrical history (oligohydramnios)
OB History

Gravidity:    2         Term:   1        Prem:   0        SAB:   0
TOP:          0       Ectopic:  0        Living: 1
Fetal Evaluation

Num Of Fetuses:     1
Fetal Heart         161
Rate(bpm):
Cardiac Activity:   Observed
Presentation:       Variable
Placenta:           Posterior, above cervical os
P. Cord Insertion:  Visualized

Amniotic Fluid
AFI FV:      Subjectively within normal limits
Biometry

BPD:      42.4  mm     G. Age:  18w 6d         56  %    CI:        75.53   %   70 - 86
FL/HC:      17.5   %   16.1 -
HC:      154.7  mm     G. Age:  18w 3d         29  %    HC/AC:      1.13       1.09 -
AC:      136.3  mm     G. Age:  19w 0d         58  %    FL/BPD:     63.7   %
FL:         27  mm     G. Age:  18w 1d         27  %    FL/AC:      19.8   %   20 - 24
HUM:      26.9  mm     G. Age:  18w 4d         47  %
CER:      19.1  mm     G. Age:  18w 4d         45  %
Est. FW:     251  gm      0 lb 9 oz     44  %
Gestational Age

LMP:           18w 5d       Date:   04/05/16                 EDD:   01/10/17
U/S Today:     18w 4d                                        EDD:   01/11/17
Best:          18w 5d    Det. By:   LMP  (04/05/16)          EDD:   01/10/17
Anatomy

Cranium:               Appears normal         Aortic Arch:            Not well visualized
Cavum:                 Appears normal         Ductal Arch:            Not well visualized
Ventricles:            Appears normal         Diaphragm:              Not well visualized
Choroid Plexus:        Appears normal         Stomach:                Not well visualized
Cerebellum:            Appears normal         Abdomen:                Appears normal
Posterior Fossa:       Appears normal         Abdominal Wall:         Appears nml (cord
insert, abd wall)
Nuchal Fold:           Appears normal         Cord Vessels:           Appears normal (3
vessel cord)
Face:                  Appears normal         Kidneys:                Appear normal
(orbits and profile)
Lips:                  Not well visualized    Bladder:                Appears normal
Thoracic:              Appears normal         Spine:                  Limited views
appear normal
Heart:                 Not well visualized    Upper Extremities:      Appears normal
RVOT:                  Not well visualized    Lower Extremities:      Appears normal
LVOT:                  Not well visualized

Other:  Fetus appears to be a male. Heels visualized.
Cervix Uterus Adnexa

Cervix
Length:              4  cm.
Normal appearance by transabdominal scan.
Impression

Singleton intrauterine pregnancy at 18+5 weeks, here for
anatomic survey
Review of the anatomy shows the heart to have coplanar
valves. the rest of the cardiac anantomy is suboptimally
visualized. the stomach is not visualized
Amniotic fluid volume is normal
Estimated fetal weight is 251g which is growth in the 44th
percentile
Recommendations

The coplanar valves are suspicious for an AV canal defect,
but I am unable to adequately visualize the outflow tracts.
I have asked her to return here in 2 weeks for a repeat scan
and have scheduled her for a fetal echogardiogram ASAP.
should she prove tpo have a baby with cardiac defect, referral
for genetic counseling and testing is recommended. I
discussed all these findings with the patient and her partner

## 2019-08-11 ENCOUNTER — Emergency Department (HOSPITAL_COMMUNITY)
Admission: EM | Admit: 2019-08-11 | Discharge: 2019-08-11 | Disposition: A | Discharge status: Home / Self Care | Payer: Medicaid Other | Attending: Emergency Medicine | Admitting: Emergency Medicine

## 2019-08-11 ENCOUNTER — Other Ambulatory Visit: Payer: Self-pay

## 2019-08-11 DIAGNOSIS — R531 Weakness: Secondary | ICD-10-CM | POA: Insufficient documentation

## 2019-08-11 DIAGNOSIS — R05 Cough: Secondary | ICD-10-CM | POA: Insufficient documentation

## 2019-08-11 DIAGNOSIS — Z5321 Procedure and treatment not carried out due to patient leaving prior to being seen by health care provider: Secondary | ICD-10-CM | POA: Insufficient documentation

## 2019-08-11 DIAGNOSIS — R439 Unspecified disturbances of smell and taste: Secondary | ICD-10-CM | POA: Insufficient documentation

## 2019-08-11 NOTE — ED Triage Notes (Signed)
C/C cough, weakness, loss of taste and smell that started Sunday.
